# Patient Record
Sex: Female | Born: 1955 | Race: White | Hispanic: No | Smoking: Never smoker
Health system: Southern US, Community
[De-identification: ages and names within clinical notes are randomized; demographics above are authoritative.]

## PROBLEM LIST (undated history)

## (undated) DIAGNOSIS — I10 Essential (primary) hypertension: Secondary | ICD-10-CM

---

## 1999-08-06 ENCOUNTER — Other Ambulatory Visit: Admission: RE | Admit: 1999-08-06 | Discharge: 1999-08-06 | Payer: Self-pay | Admitting: Gynecology

## 2000-08-24 ENCOUNTER — Other Ambulatory Visit: Admission: RE | Admit: 2000-08-24 | Discharge: 2000-08-24 | Payer: Self-pay | Admitting: Gynecology

## 2001-10-05 ENCOUNTER — Encounter: Payer: Self-pay | Admitting: Gynecology

## 2001-10-05 ENCOUNTER — Encounter: Admission: RE | Admit: 2001-10-05 | Discharge: 2001-10-05 | Payer: Self-pay | Admitting: Gynecology

## 2001-10-16 ENCOUNTER — Other Ambulatory Visit: Admission: RE | Admit: 2001-10-16 | Discharge: 2001-10-16 | Payer: Self-pay | Admitting: Gynecology

## 2002-11-11 ENCOUNTER — Other Ambulatory Visit: Admission: RE | Admit: 2002-11-11 | Discharge: 2002-11-11 | Payer: Self-pay | Admitting: Gynecology

## 2004-03-22 ENCOUNTER — Other Ambulatory Visit: Admission: RE | Admit: 2004-03-22 | Discharge: 2004-03-22 | Payer: Self-pay | Admitting: Gynecology

## 2005-05-16 ENCOUNTER — Other Ambulatory Visit: Admission: RE | Admit: 2005-05-16 | Discharge: 2005-05-16 | Payer: Self-pay | Admitting: Gynecology

## 2005-05-31 ENCOUNTER — Encounter: Admission: RE | Admit: 2005-05-31 | Discharge: 2005-05-31 | Payer: Self-pay | Admitting: Gynecology

## 2006-12-04 ENCOUNTER — Other Ambulatory Visit: Admission: RE | Admit: 2006-12-04 | Discharge: 2006-12-04 | Payer: Self-pay | Admitting: Gynecology

## 2008-01-29 ENCOUNTER — Encounter: Admission: RE | Admit: 2008-01-29 | Discharge: 2008-01-29 | Payer: Self-pay | Admitting: Gynecology

## 2010-08-17 ENCOUNTER — Encounter: Admission: RE | Admit: 2010-08-17 | Discharge: 2010-08-17 | Payer: Self-pay | Admitting: Gynecology

## 2012-02-24 ENCOUNTER — Other Ambulatory Visit: Payer: Self-pay | Admitting: Gynecology

## 2012-02-24 DIAGNOSIS — Z1231 Encounter for screening mammogram for malignant neoplasm of breast: Secondary | ICD-10-CM

## 2012-03-02 ENCOUNTER — Ambulatory Visit
Admission: RE | Admit: 2012-03-02 | Discharge: 2012-03-02 | Disposition: A | Discharge status: Home / Self Care | Payer: BC Managed Care – PPO | Source: Ambulatory Visit | Attending: Gynecology | Admitting: Gynecology

## 2012-03-02 DIAGNOSIS — Z1231 Encounter for screening mammogram for malignant neoplasm of breast: Secondary | ICD-10-CM

## 2014-09-23 ENCOUNTER — Other Ambulatory Visit: Payer: Self-pay

## 2014-09-23 DIAGNOSIS — Z1231 Encounter for screening mammogram for malignant neoplasm of breast: Secondary | ICD-10-CM

## 2014-10-07 ENCOUNTER — Ambulatory Visit
Admission: RE | Admit: 2014-10-07 | Discharge: 2014-10-07 | Disposition: A | Discharge status: Home / Self Care | Payer: BC Managed Care – PPO | Source: Ambulatory Visit

## 2014-10-07 DIAGNOSIS — Z1231 Encounter for screening mammogram for malignant neoplasm of breast: Secondary | ICD-10-CM

## 2016-03-29 DIAGNOSIS — B009 Herpesviral infection, unspecified: Secondary | ICD-10-CM | POA: Diagnosis not present

## 2016-03-29 DIAGNOSIS — Z0000 Encounter for general adult medical examination without abnormal findings: Secondary | ICD-10-CM | POA: Diagnosis not present

## 2016-03-29 DIAGNOSIS — I10 Essential (primary) hypertension: Secondary | ICD-10-CM | POA: Diagnosis not present

## 2016-10-04 ENCOUNTER — Encounter (HOSPITAL_COMMUNITY): Payer: Self-pay | Admitting: *Deleted

## 2016-10-04 DIAGNOSIS — W01198A Fall on same level from slipping, tripping and stumbling with subsequent striking against other object, initial encounter: Secondary | ICD-10-CM | POA: Diagnosis not present

## 2016-10-04 DIAGNOSIS — Y939 Activity, unspecified: Secondary | ICD-10-CM | POA: Insufficient documentation

## 2016-10-04 DIAGNOSIS — Z79899 Other long term (current) drug therapy: Secondary | ICD-10-CM | POA: Diagnosis not present

## 2016-10-04 DIAGNOSIS — S0003XA Contusion of scalp, initial encounter: Secondary | ICD-10-CM | POA: Diagnosis not present

## 2016-10-04 DIAGNOSIS — S0990XA Unspecified injury of head, initial encounter: Secondary | ICD-10-CM | POA: Diagnosis present

## 2016-10-04 DIAGNOSIS — Y929 Unspecified place or not applicable: Secondary | ICD-10-CM | POA: Diagnosis not present

## 2016-10-04 DIAGNOSIS — S20212A Contusion of left front wall of thorax, initial encounter: Secondary | ICD-10-CM | POA: Diagnosis not present

## 2016-10-04 DIAGNOSIS — Y999 Unspecified external cause status: Secondary | ICD-10-CM | POA: Insufficient documentation

## 2016-10-04 DIAGNOSIS — I10 Essential (primary) hypertension: Secondary | ICD-10-CM | POA: Diagnosis not present

## 2016-10-04 NOTE — ED Triage Notes (Signed)
The pt was standing on a vanity cleaning her light fisture wheb she lost her babalce abd fell backward and struck the back of her head causing  A laceration  With ? Loc  The fall occurred at 1600  She is also c/o lt lower rib pain especially with movement

## 2016-10-05 ENCOUNTER — Emergency Department (HOSPITAL_COMMUNITY): Payer: Managed Care, Other (non HMO)

## 2016-10-05 ENCOUNTER — Emergency Department (HOSPITAL_COMMUNITY)
Admission: EM | Admit: 2016-10-05 | Discharge: 2016-10-05 | Disposition: A | Discharge status: Home / Self Care | Payer: Managed Care, Other (non HMO) | Attending: Emergency Medicine | Admitting: Emergency Medicine

## 2016-10-05 DIAGNOSIS — S20212A Contusion of left front wall of thorax, initial encounter: Secondary | ICD-10-CM

## 2016-10-05 DIAGNOSIS — W19XXXA Unspecified fall, initial encounter: Secondary | ICD-10-CM

## 2016-10-05 DIAGNOSIS — S0093XA Contusion of unspecified part of head, initial encounter: Secondary | ICD-10-CM

## 2016-10-05 HISTORY — DX: Essential (primary) hypertension: I10

## 2016-10-05 MED ORDER — KETOROLAC TROMETHAMINE 15 MG/ML IJ SOLN
15.0000 mg | Freq: Once | INTRAMUSCULAR | Status: AC
  Administered 2016-10-05: 15 mg via INTRAVENOUS
  Filled 2016-10-05: qty 1

## 2016-10-05 MED ORDER — HYDROCODONE-ACETAMINOPHEN 5-325 MG PO TABS
1.0000 | ORAL_TABLET | Freq: Once | ORAL | Status: AC
  Administered 2016-10-05: 1 via ORAL
  Filled 2016-10-05: qty 1

## 2016-10-05 MED ORDER — METHOCARBAMOL 500 MG PO TABS: 500.0000 mg | ORAL_TABLET | Freq: Two times a day (BID) | ORAL | 0 refills | Status: AC

## 2016-10-05 MED ORDER — HYDROCODONE-ACETAMINOPHEN 5-325 MG PO TABS: 1.0000 | ORAL_TABLET | Freq: Four times a day (QID) | ORAL | 0 refills | Status: AC | PRN

## 2016-10-05 MED ORDER — IBUPROFEN 400 MG PO TABS: 400.0000 mg | ORAL_TABLET | Freq: Four times a day (QID) | ORAL | 0 refills | Status: AC | PRN

## 2016-10-05 NOTE — ED Provider Notes (Signed)
[facility]EM[facility]ENCY DEPT Provider Note   CSN: [id number] Arrival date & time: 10/04/16  1933  By signing my name below, I, Doreatha MartinEva Mathews, attest that this documentation has been prepared under the direction and in the presence of Derwood KaplanAnkit Zebastian Carico, MD. Electronically Signed: Doreatha MartinEva Mathews, ED Scribe. 10/05/16. 1:17 AM.     History   Chief Complaint Chief Complaint  Patient presents with  . Fall    HPI Elizabeth Clements is a 60 y.o. female who presents to the Emergency Department complaining of a laceration with controlled bleeding to the scalp s/p mechanical fall that occurred last night. Pt states she was standing on a counter cleaning when her foot slipped and she fell backward, striking her head on the tile floor. She denies LOC. She also complains of dorsal HA, left-sided CP at this time. Pt states her CP is worsened with breathing and pressure application. She states she has not looked at her chest to appreciate any bruising. Pt does not currently take any anticoagulants. She denies seizure activity, confusion, numbness, tingling, vomiting, additional injuries.    The history is provided by the patient. No language interpreter was used.    Past Medical History:  Diagnosis Date  . Hypertension     There are no active problems to display for this patient.   History reviewed. No pertinent surgical history.  OB History    No data available       Home Medications    Prior to Admission medications   Medication Sig Start Date End Date Taking? Authorizing Provider  losartan-hydrochlorothiazide (HYZAAR) 100-12.5 MG tablet Take 1 tablet by mouth daily. 09/28/16  Yes Historical Provider, MD  HYDROcodone-acetaminophen (NORCO/VICODIN) 5-325 MG tablet Take 1 tablet by mouth every 6 (six) hours as needed for severe pain. 10/05/16   Derwood KaplanAnkit Nahomi Hegner, MD  ibuprofen (ADVIL,MOTRIN) 400 MG tablet Take 1 tablet (400 mg total) by mouth every 6 (six) hours as needed. 10/05/16   Derwood KaplanAnkit Jamareon Shimel, MD    methocarbamol (ROBAXIN) 500 MG tablet Take 1 tablet (500 mg total) by mouth 2 (two) times daily. 10/05/16   Derwood KaplanAnkit Gwynn Chalker, MD    Family History No family history on file.  Social History Social History  Substance Use Topics  . Smoking status: Never Smoker  . Smokeless tobacco: Never Used  . Alcohol use No     Allergies   Penicillins   Review of Systems Review of Systems A complete 10 system review of systems was obtained and all systems are negative except as noted in the HPI and PMH.    Physical Exam Updated Vital Signs BP 132/88   Pulse 70   Temp 98 F (36.7 C)   Resp 18   Ht 5' 1.5" (1.562 m)   Wt 147 lb 8 oz (66.9 kg)   SpO2 100%   BMI 27.42 kg/m   Physical Exam  Constitutional: She appears well-developed and well-nourished.  HENT:  Head: Normocephalic.  Pt has a posterior scalp hematoma with bruising and a very superficial laceration that measures 1 cm. No active bleeding at this time.   Eyes: Conjunctivae are normal.  Cardiovascular: Normal rate, regular rhythm and normal heart sounds.   Pulmonary/Chest: Effort normal and breath sounds normal. No respiratory distress. She has no wheezes.  Bilateral breath sounds.   Abdominal: She exhibits no distension.  Musculoskeletal: Normal range of motion.  No midline C-spine tenderness. Pt does have tenderness with lateral movement of the head. Pt has left sided tenderness over the hip; however, ROM  of the left hip, internal and external rotation, and resistance activity do not show profound discomfort.   Neurological: She is alert.  Skin: Skin is warm and dry.  Psychiatric: She has a normal mood and affect. Her behavior is normal.  Nursing note and vitals reviewed.    ED Treatments / Results   DIAGNOSTIC STUDIES: Oxygen Saturation is 100% on RA, normal by my interpretation.    COORDINATION OF CARE: 12:53 AM Discussed treatment plan with pt at bedside which includes CT head, XR left ribs w/ chest and pt  agreed to plan.    Labs (all labs ordered are listed, but only abnormal results are displayed) Labs Reviewed - No data to display  EKG  EKG Interpretation None       Radiology Dg Ribs Unilateral W/chest Left  Result Date: 10/05/2016 CLINICAL DATA:  Status post fall, with left anterior rib pain. Initial encounter. EXAM: LEFT RIBS AND CHEST - 3+ VIEW COMPARISON:  None. FINDINGS: No displaced rib fractures are seen. The lungs are well-aerated. Minimal left basilar atelectasis is noted. There is no evidence of pleural effusion or pneumothorax. The cardiomediastinal silhouette is within normal limits. No acute osseous abnormalities are seen. IMPRESSION: Minimal left basilar atelectasis noted. Lungs otherwise clear. No displaced rib fracture seen. Electronically Signed   By: Roanna Raider M.D.   On: 10/05/2016 02:06   Ct Head Wo Contrast  Result Date: 10/05/2016 CLINICAL DATA:  Fall with head laceration. Slip and fall while cleaning, striking head on tile floor. No loss of consciousness. EXAM: CT HEAD WITHOUT CONTRAST TECHNIQUE: Contiguous axial images were obtained from the base of the skull through the vertex without intravenous contrast. COMPARISON:  None. FINDINGS: Brain: No evidence of acute infarction, hemorrhage, hydrocephalus, extra-axial collection or mass lesion/mass effect. Vascular: No hyperdense vessel or unexpected calcification. Skull: Small left parietal scalp laceration and hematoma. No calvarial fracture. Sinuses/Orbits: No acute finding. Other: None. IMPRESSION: Left parietal scalp laceration and hematoma. No fracture or acute intracranial abnormality. Electronically Signed   By: Rubye Oaks M.D.   On: 10/05/2016 03:19    Procedures Procedures (including critical care time)  Medications Ordered in ED Medications  ketorolac (TORADOL) 15 MG/ML injection 15 mg (15 mg Intravenous Given 10/05/16 0336)  HYDROcodone-acetaminophen (NORCO/VICODIN) 5-325 MG per tablet 1  tablet (1 tablet Oral Given 10/05/16 0336)     Initial Impression / Assessment and Plan / ED Course  I have reviewed the triage vital signs and the nursing notes.  Pertinent labs & imaging results that were available during my care of the patient were reviewed by me and considered in my medical decision making (see chart for details).  Clinical Course   I personally performed the services described in this documentation, which was scribed in my presence. The recorded information has been reviewed and is accurate.  PT with fall. DDx includes: - Mechanical falls - ICH - Fractures - Contusions - Soft tissue injury  Appropriate imaging ordered, and look reassuring. Cspine cleared clinically.   Final Clinical Impressions(s) / ED Diagnoses   Final diagnoses:  Fall, initial encounter  Contusion of head, unspecified part of head, initial encounter  Rib contusion, left, initial encounter    New Prescriptions Discharge Medication List as of 10/05/2016  3:29 AM    START taking these medications   Details  HYDROcodone-acetaminophen (NORCO/VICODIN) 5-325 MG tablet Take 1 tablet by mouth every 6 (six) hours as needed for severe pain., Starting Wed 10/05/2016, Print    ibuprofen (ADVIL,MOTRIN)  400 MG tablet Take 1 tablet (400 mg total) by mouth every 6 (six) hours as needed., Starting Wed 10/05/2016, Print    methocarbamol (ROBAXIN) 500 MG tablet Take 1 tablet (500 mg total) by mouth 2 (two) times daily., Starting Wed 10/05/2016, Print           Derwood Kaplan, MD 10/06/16 [phone]

## 2016-10-05 NOTE — Discharge Instructions (Signed)
We saw you in the ER after you had a fall. All the imaging results are normal, no fractures seen. No evidence of brain bleed. Please be very careful with walking, and do everything possible to prevent falls.  You likely have contusion from the trauma, and the pain might get worse in 1-2 days. Please take ibuprofen round the clock for the 2 days and then as needed.

## 2016-10-05 NOTE — ED Notes (Signed)
Pt back from CT

## 2016-10-05 NOTE — ED Notes (Signed)
Patient transported to CT via stretcher.

## 2016-10-05 NOTE — ED Notes (Signed)
Nanavati, MD at bedside. 

## 2017-03-15 ENCOUNTER — Ambulatory Visit
Admission: RE | Admit: 2017-03-15 | Discharge: 2017-03-15 | Disposition: A | Discharge status: Home / Self Care | Payer: Managed Care, Other (non HMO) | Source: Ambulatory Visit | Attending: Family Medicine | Admitting: Family Medicine

## 2017-03-15 ENCOUNTER — Other Ambulatory Visit: Payer: Self-pay | Admitting: Family Medicine

## 2017-03-15 DIAGNOSIS — Z1231 Encounter for screening mammogram for malignant neoplasm of breast: Secondary | ICD-10-CM

## 2017-06-16 ENCOUNTER — Other Ambulatory Visit: Payer: Self-pay | Admitting: Family Medicine

## 2017-06-16 ENCOUNTER — Other Ambulatory Visit (HOSPITAL_COMMUNITY)
Admission: RE | Admit: 2017-06-16 | Discharge: 2017-06-16 | Disposition: A | Discharge status: Home / Self Care | Payer: BLUE CROSS/BLUE SHIELD | Source: Ambulatory Visit | Attending: Family Medicine | Admitting: Family Medicine

## 2017-06-16 DIAGNOSIS — I10 Essential (primary) hypertension: Secondary | ICD-10-CM | POA: Diagnosis not present

## 2017-06-16 DIAGNOSIS — Z124 Encounter for screening for malignant neoplasm of cervix: Secondary | ICD-10-CM | POA: Insufficient documentation

## 2017-06-16 DIAGNOSIS — Z0000 Encounter for general adult medical examination without abnormal findings: Secondary | ICD-10-CM | POA: Diagnosis not present

## 2017-06-20 LAB — CYTOLOGY - PAP: DIAGNOSIS: NEGATIVE

## 2017-07-26 DIAGNOSIS — D179 Benign lipomatous neoplasm, unspecified: Secondary | ICD-10-CM | POA: Diagnosis not present

## 2018-06-21 DIAGNOSIS — I10 Essential (primary) hypertension: Secondary | ICD-10-CM | POA: Diagnosis not present

## 2018-06-21 DIAGNOSIS — Z0000 Encounter for general adult medical examination without abnormal findings: Secondary | ICD-10-CM | POA: Diagnosis not present

## 2018-06-21 DIAGNOSIS — Z1159 Encounter for screening for other viral diseases: Secondary | ICD-10-CM | POA: Diagnosis not present

## 2018-07-04 DIAGNOSIS — R894 Abnormal immunological findings in specimens from other organs, systems and tissues: Secondary | ICD-10-CM | POA: Diagnosis not present

## 2018-07-04 DIAGNOSIS — R896 Abnormal cytological findings in specimens from other organs, systems and tissues: Secondary | ICD-10-CM | POA: Diagnosis not present

## 2018-07-27 DIAGNOSIS — D1720 Benign lipomatous neoplasm of skin and subcutaneous tissue of unspecified limb: Secondary | ICD-10-CM | POA: Diagnosis not present

## 2018-08-06 DIAGNOSIS — L259 Unspecified contact dermatitis, unspecified cause: Secondary | ICD-10-CM | POA: Diagnosis not present

## 2018-08-30 DIAGNOSIS — B182 Chronic viral hepatitis C: Secondary | ICD-10-CM | POA: Diagnosis not present

## 2018-09-05 ENCOUNTER — Other Ambulatory Visit: Payer: Self-pay | Admitting: Nurse Practitioner

## 2018-09-05 DIAGNOSIS — B182 Chronic viral hepatitis C: Secondary | ICD-10-CM

## 2018-09-13 ENCOUNTER — Ambulatory Visit
Admission: RE | Admit: 2018-09-13 | Discharge: 2018-09-13 | Disposition: A | Discharge status: Home / Self Care | Payer: BLUE CROSS/BLUE SHIELD | Source: Ambulatory Visit | Attending: Nurse Practitioner | Admitting: Nurse Practitioner

## 2018-09-13 DIAGNOSIS — B182 Chronic viral hepatitis C: Secondary | ICD-10-CM

## 2018-10-10 DIAGNOSIS — B182 Chronic viral hepatitis C: Secondary | ICD-10-CM | POA: Diagnosis not present

## 2018-10-15 ENCOUNTER — Other Ambulatory Visit: Payer: Self-pay | Admitting: Family Medicine

## 2018-10-15 DIAGNOSIS — Z1231 Encounter for screening mammogram for malignant neoplasm of breast: Secondary | ICD-10-CM

## 2018-11-05 DIAGNOSIS — Z780 Asymptomatic menopausal state: Secondary | ICD-10-CM | POA: Diagnosis not present

## 2018-11-05 DIAGNOSIS — M8588 Other specified disorders of bone density and structure, other site: Secondary | ICD-10-CM | POA: Diagnosis not present

## 2018-11-19 DIAGNOSIS — B182 Chronic viral hepatitis C: Secondary | ICD-10-CM | POA: Diagnosis not present

## 2018-11-20 ENCOUNTER — Ambulatory Visit
Admission: RE | Admit: 2018-11-20 | Discharge: 2018-11-20 | Disposition: A | Discharge status: Home / Self Care | Payer: BLUE CROSS/BLUE SHIELD | Source: Ambulatory Visit | Attending: Family Medicine | Admitting: Family Medicine

## 2018-11-20 DIAGNOSIS — Z1231 Encounter for screening mammogram for malignant neoplasm of breast: Secondary | ICD-10-CM | POA: Diagnosis not present

## 2018-12-14 DIAGNOSIS — B182 Chronic viral hepatitis C: Secondary | ICD-10-CM | POA: Diagnosis not present

## 2018-12-31 DIAGNOSIS — B182 Chronic viral hepatitis C: Secondary | ICD-10-CM | POA: Diagnosis not present

## 2019-03-07 DIAGNOSIS — B182 Chronic viral hepatitis C: Secondary | ICD-10-CM | POA: Diagnosis not present

## 2019-03-18 DIAGNOSIS — B182 Chronic viral hepatitis C: Secondary | ICD-10-CM | POA: Diagnosis not present

## 2019-03-18 DIAGNOSIS — K824 Cholesterolosis of gallbladder: Secondary | ICD-10-CM | POA: Diagnosis not present

## 2019-07-29 ENCOUNTER — Other Ambulatory Visit: Payer: Self-pay | Admitting: Family Medicine

## 2019-07-29 DIAGNOSIS — K829 Disease of gallbladder, unspecified: Secondary | ICD-10-CM

## 2019-07-29 DIAGNOSIS — I10 Essential (primary) hypertension: Secondary | ICD-10-CM | POA: Diagnosis not present

## 2019-07-29 DIAGNOSIS — R932 Abnormal findings on diagnostic imaging of liver and biliary tract: Secondary | ICD-10-CM | POA: Diagnosis not present

## 2019-07-29 DIAGNOSIS — N6012 Diffuse cystic mastopathy of left breast: Secondary | ICD-10-CM

## 2019-07-29 DIAGNOSIS — N644 Mastodynia: Secondary | ICD-10-CM

## 2019-07-29 DIAGNOSIS — Z0000 Encounter for general adult medical examination without abnormal findings: Secondary | ICD-10-CM | POA: Diagnosis not present

## 2019-07-29 DIAGNOSIS — B1920 Unspecified viral hepatitis C without hepatic coma: Secondary | ICD-10-CM | POA: Diagnosis not present

## 2019-08-01 ENCOUNTER — Other Ambulatory Visit: Payer: Self-pay

## 2019-08-01 ENCOUNTER — Ambulatory Visit
Admission: RE | Admit: 2019-08-01 | Discharge: 2019-08-01 | Disposition: A | Discharge status: Home / Self Care | Payer: BC Managed Care – PPO | Source: Ambulatory Visit | Attending: Family Medicine | Admitting: Family Medicine

## 2019-08-01 ENCOUNTER — Ambulatory Visit: Payer: BLUE CROSS/BLUE SHIELD

## 2019-08-01 DIAGNOSIS — N644 Mastodynia: Secondary | ICD-10-CM | POA: Diagnosis not present

## 2019-08-01 DIAGNOSIS — N6012 Diffuse cystic mastopathy of left breast: Secondary | ICD-10-CM

## 2019-08-05 ENCOUNTER — Other Ambulatory Visit: Payer: BLUE CROSS/BLUE SHIELD

## 2019-08-08 ENCOUNTER — Ambulatory Visit
Admission: RE | Admit: 2019-08-08 | Discharge: 2019-08-08 | Disposition: A | Discharge status: Home / Self Care | Payer: BC Managed Care – PPO | Source: Ambulatory Visit | Attending: Family Medicine | Admitting: Family Medicine

## 2019-08-08 DIAGNOSIS — K838 Other specified diseases of biliary tract: Secondary | ICD-10-CM | POA: Diagnosis not present

## 2019-08-08 DIAGNOSIS — K829 Disease of gallbladder, unspecified: Secondary | ICD-10-CM

## 2019-08-08 DIAGNOSIS — K7689 Other specified diseases of liver: Secondary | ICD-10-CM | POA: Diagnosis not present

## 2019-08-08 DIAGNOSIS — K828 Other specified diseases of gallbladder: Secondary | ICD-10-CM | POA: Diagnosis not present

## 2019-11-05 ENCOUNTER — Other Ambulatory Visit: Payer: Self-pay | Admitting: Family Medicine

## 2019-11-05 DIAGNOSIS — Z1231 Encounter for screening mammogram for malignant neoplasm of breast: Secondary | ICD-10-CM

## 2019-12-26 ENCOUNTER — Ambulatory Visit: Payer: BC Managed Care – PPO

## 2019-12-31 ENCOUNTER — Other Ambulatory Visit: Payer: Self-pay

## 2019-12-31 ENCOUNTER — Ambulatory Visit
Admission: RE | Admit: 2019-12-31 | Discharge: 2019-12-31 | Disposition: A | Discharge status: Home / Self Care | Payer: BC Managed Care – PPO | Source: Ambulatory Visit | Attending: Family Medicine | Admitting: Family Medicine

## 2019-12-31 DIAGNOSIS — Z1231 Encounter for screening mammogram for malignant neoplasm of breast: Secondary | ICD-10-CM | POA: Diagnosis not present

## 2020-07-29 ENCOUNTER — Other Ambulatory Visit (HOSPITAL_COMMUNITY)
Admission: RE | Admit: 2020-07-29 | Discharge: 2020-07-29 | Disposition: A | Discharge status: Home / Self Care | Payer: BC Managed Care – PPO | Source: Ambulatory Visit | Attending: Family Medicine | Admitting: Family Medicine

## 2020-07-29 ENCOUNTER — Other Ambulatory Visit: Payer: Self-pay | Admitting: Family Medicine

## 2020-07-29 DIAGNOSIS — Z0000 Encounter for general adult medical examination without abnormal findings: Secondary | ICD-10-CM | POA: Diagnosis not present

## 2020-07-29 DIAGNOSIS — Z124 Encounter for screening for malignant neoplasm of cervix: Secondary | ICD-10-CM | POA: Insufficient documentation

## 2020-07-29 DIAGNOSIS — R894 Abnormal immunological findings in specimens from other organs, systems and tissues: Secondary | ICD-10-CM | POA: Diagnosis not present

## 2020-07-29 DIAGNOSIS — I10 Essential (primary) hypertension: Secondary | ICD-10-CM | POA: Diagnosis not present

## 2020-07-29 DIAGNOSIS — Z13220 Encounter for screening for lipoid disorders: Secondary | ICD-10-CM | POA: Diagnosis not present

## 2020-07-31 LAB — CYTOLOGY - PAP: Diagnosis: NEGATIVE

## 2020-08-23 IMAGING — US US ABDOMEN COMPLETE W/ ELASTOGRAPHY
1 series · 12 of 25 positions shown · non-contrast
Comparison: None.

CLINICAL DATA: Chronic hepatitis-C.



[Series 1: us abdomen complete w/ elastography · 0.14mm/px · 12 of 89 slices shown]
[im 4/89]
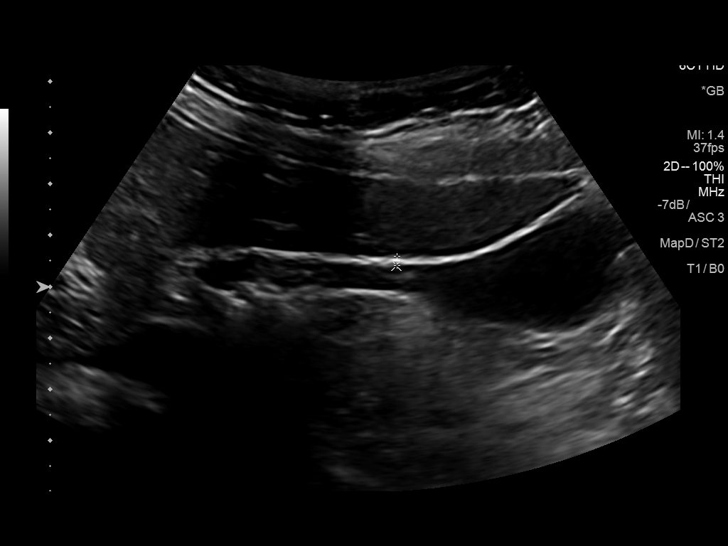
[im 12/89]
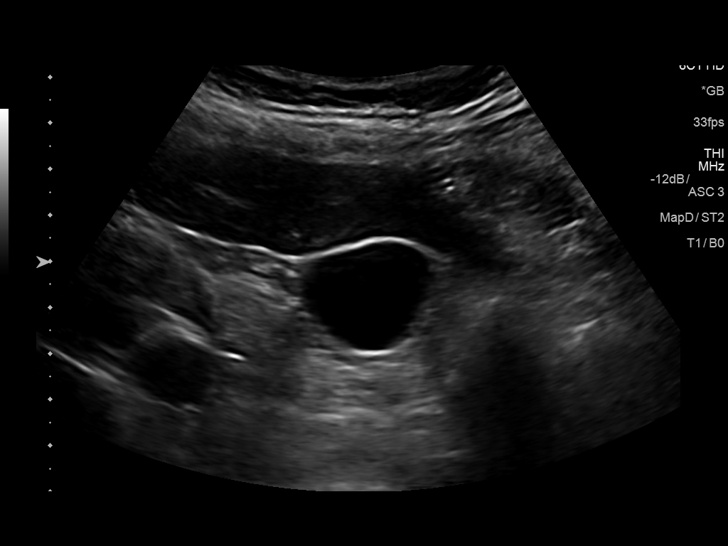
[im 19/89]
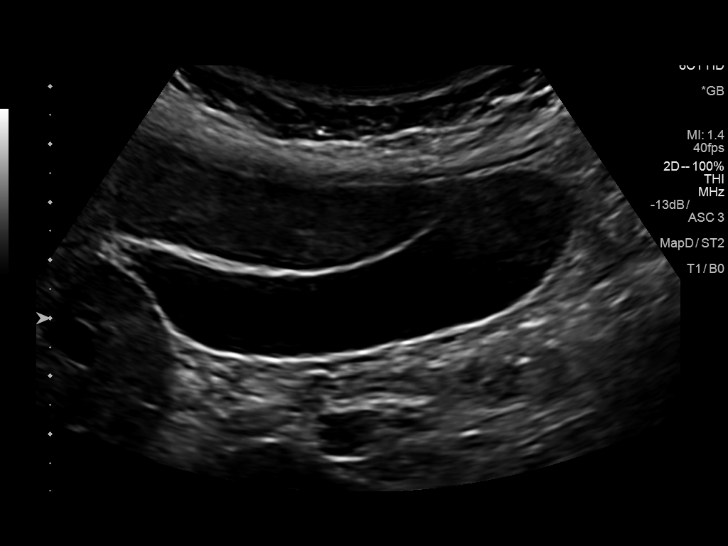
[im 26/89]
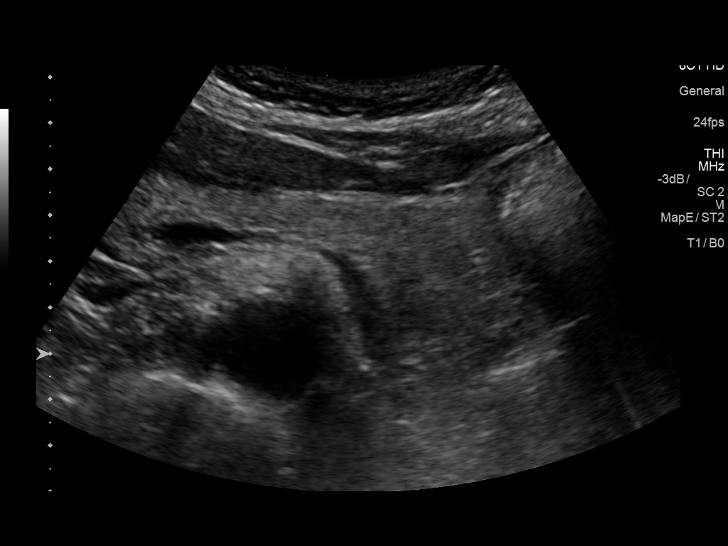
[im 34/89]
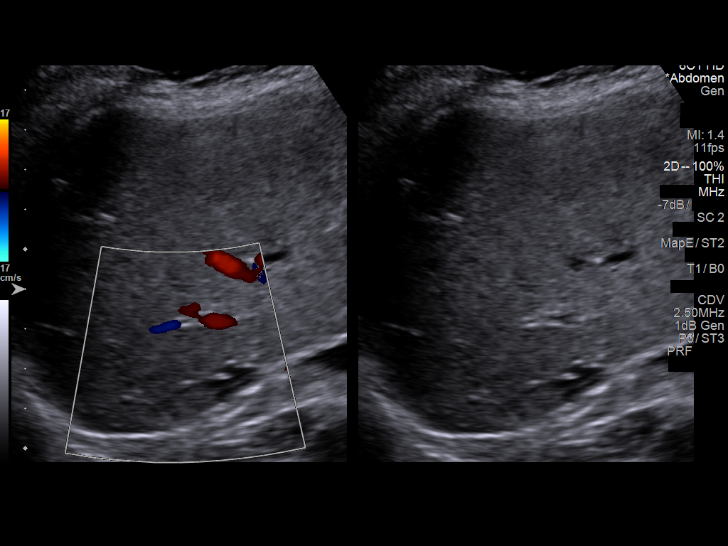
[im 41/89]
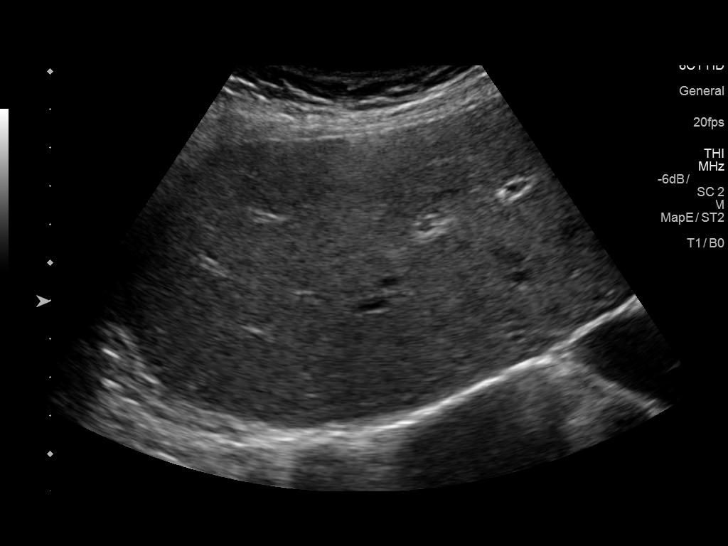
[im 48/89]
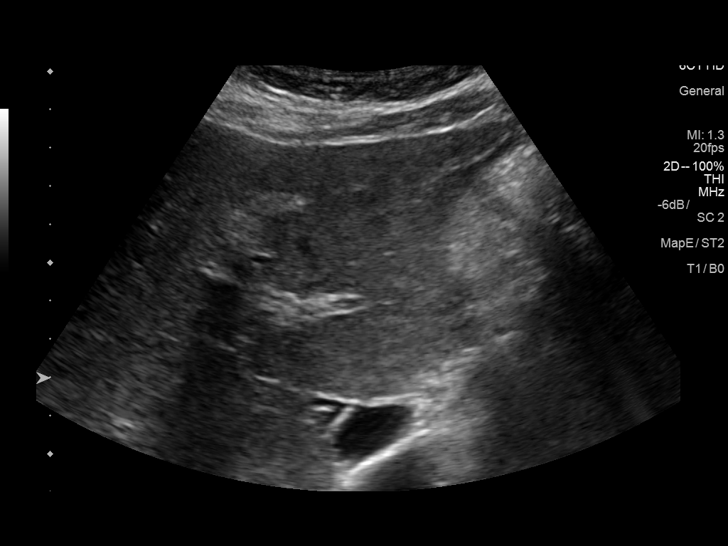
[im 56/89]
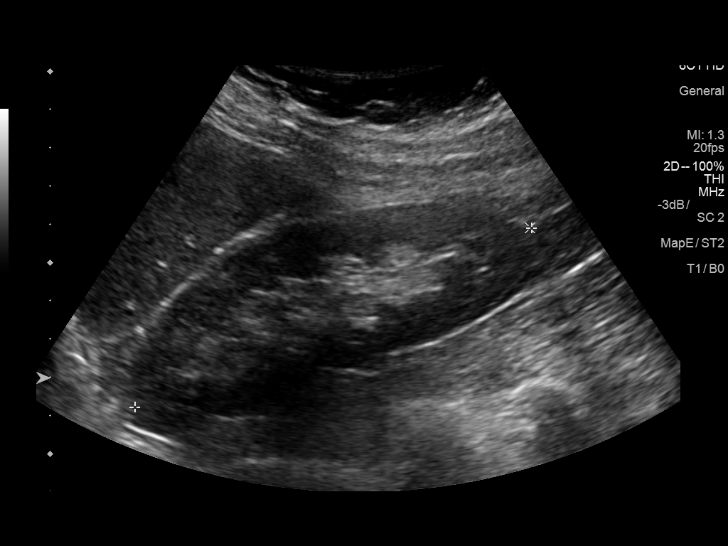
[im 63/89]
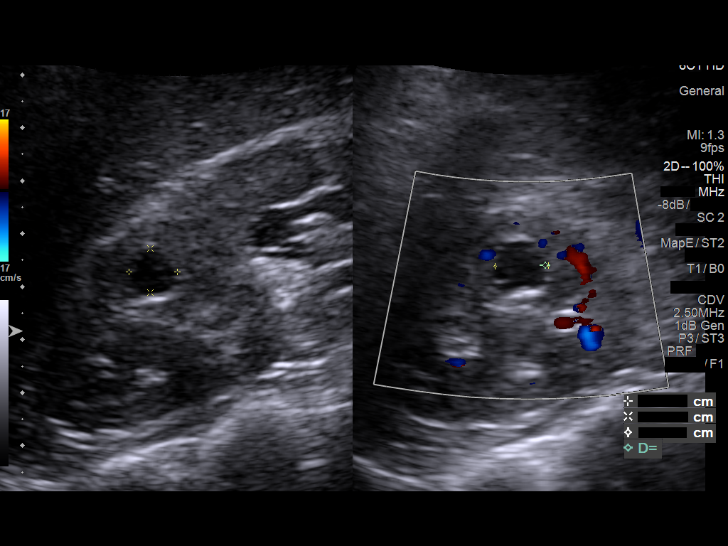
[im 70/89]
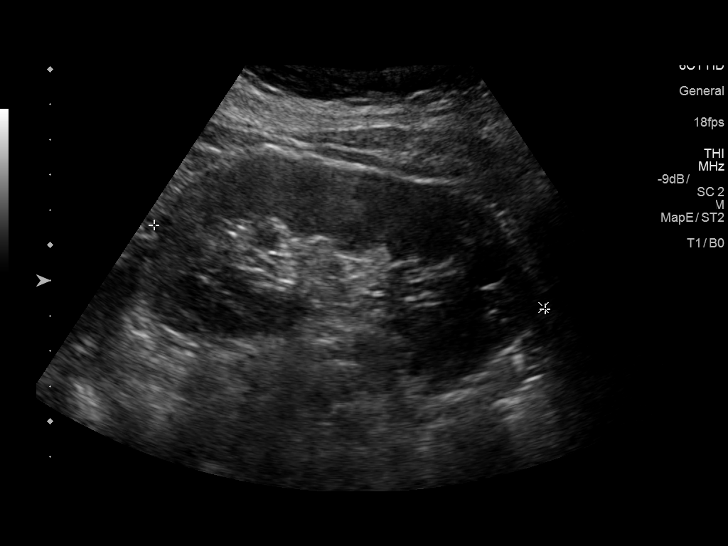
[im 78/89]
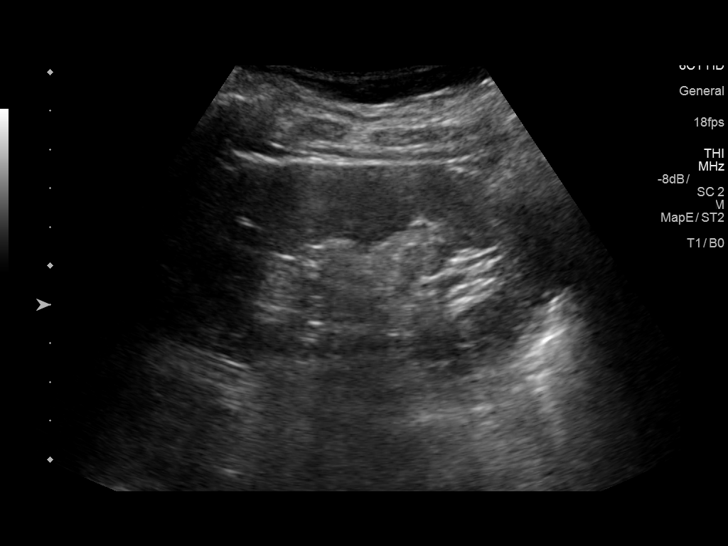
[im 85/89]
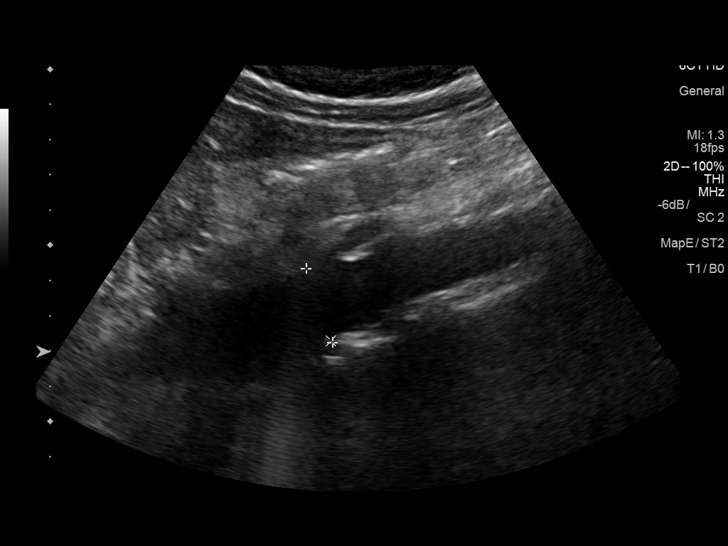

[12 of 25 positions shown; findings below may reference images not displayed]

FINDINGS: ULTRASOUND ABDOMEN

Gallbladder: No gallstones identified. A 8 mm hypoechoic filling
defect is seen near the gallbladder neck which shows no acoustic
shadowing. This could represent a gallbladder polyp or tumefactive
sludge. No evidence of gallbladder wall thickening or
pericholecystic fluid. No sonographic Murphy sign noted by
sonographer.

Common bile duct: Diameter: 5 mm, within normal limits.

Liver: Small cyst in posterior right hepatic lobe measuring 1.3 cm.
No solid liver mass identified. Within normal limits in parenchymal
echogenicity. Portal vein is patent on color Doppler imaging with
normal direction of blood flow towards the liver.

IVC: No abnormality visualized.

Pancreas: Visualized portion unremarkable.

Spleen: Size and appearance within normal limits.

Right Kidney: Length: 11.4 cm. Echogenicity within normal limits.
1.0 cm cyst seen in upper pole. No mass or hydronephrosis
visualized.

Left Kidney: Length: 11.3 cm. Echogenicity within normal limits.
cm cyst seen in lower pole. No mass or hydronephrosis visualized.

Abdominal aorta: No aneurysm visualized.

Other findings: None.

ULTRASOUND HEPATIC ELASTOGRAPHY

Device: Siemens Helix VTQ

Patient position: Oblique

Transducer [id number]

Number of measurements: 10

Hepatic segment:  8

Median velocity:   0.72 m/sec

IQR:

IQR/Median velocity ratio:

Corresponding Metavir fibrosis score:  F0/F1

Risk of fibrosis: Minimal

Limitations of exam: None

Please note that abnormal shear wave velocities may also be
identified in clinical settings other than with hepatic fibrosis,
such as: acute hepatitis, elevated right heart and central venous
pressures including use of beta blockers, Adursh disease
(Nicasio), infiltrative processes such as
mastocytosis/amyloidosis/infiltrative tumor, extrahepatic
cholestasis, in the post-prandial state, and liver transplantation.
Correlation with patient history, laboratory data, and clinical
condition recommended.
IMPRESSION: ULTRASOUND ABDOMEN:

8 mm gallbladder polyp versus tumefactive sludge. Recommend
follow-up right upper quadrant ultrasound in 1 month.

No other significant abnormality identified.

ULTRASOUND HEPATIC ELASTOGRAPHY:

Median hepatic shear wave velocity is calculated at 0.72 m/sec.

Corresponding Metavir fibrosis score is F0/F1.

Risk of fibrosis is Minimal.

Follow-up: None required

## 2020-10-22 IMAGING — MG DIGITAL SCREENING BILAT W/ TOMO W/ CAD
8 series · 8 of 24 positions shown · non-contrast
Comparison: Previous exam(s).

CLINICAL DATA: Screening.

EXAM:
DIGITAL SCREENING BILATERAL MAMMOGRAM WITH TOMO AND CAD

[R MLO synth-2D]
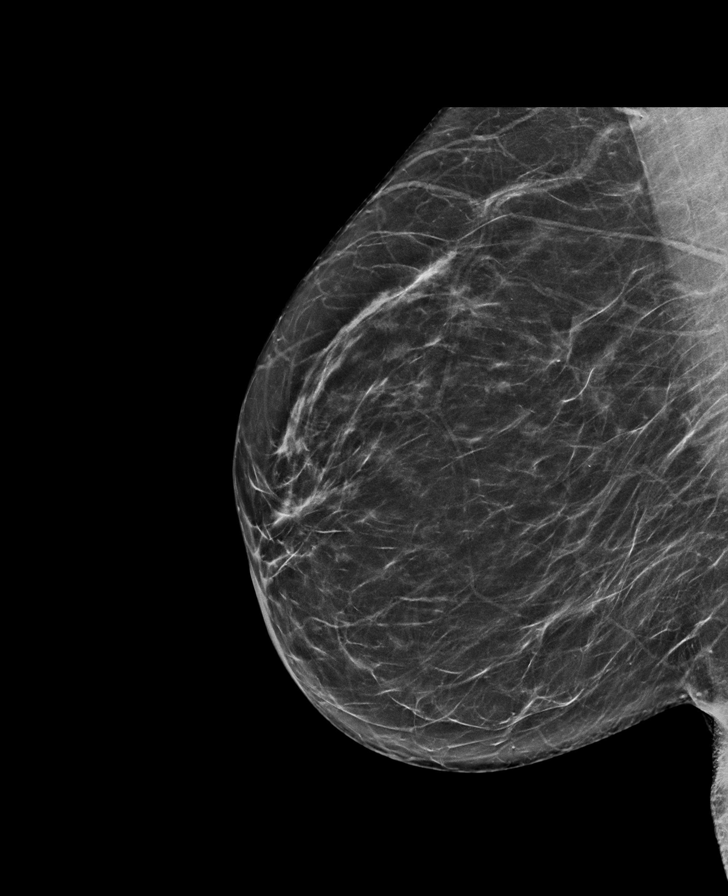

[L CC synth-2D]
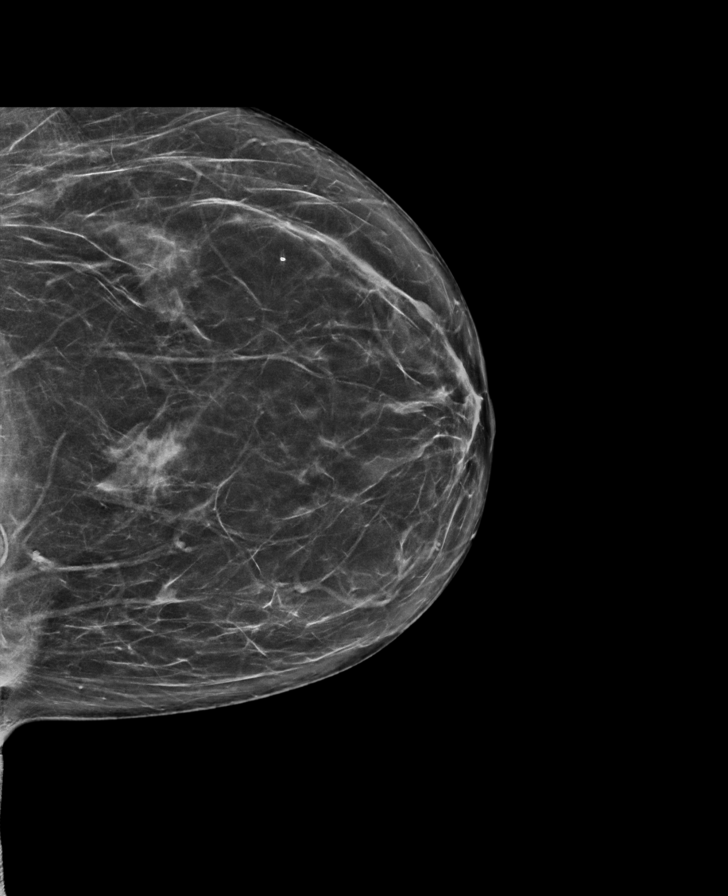

[L MLO synth-2D]
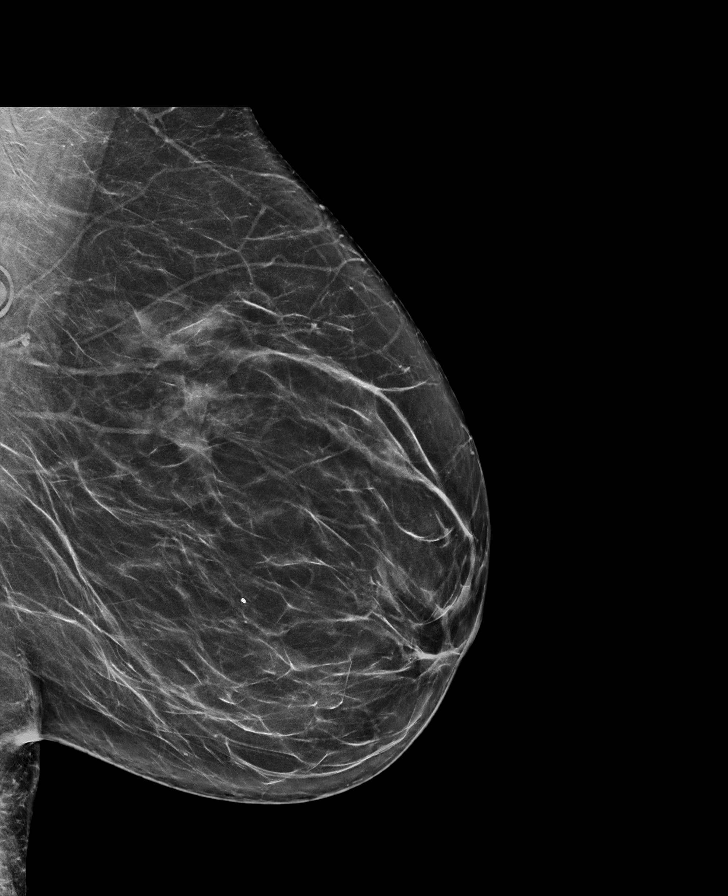

[R CC synth-2D]
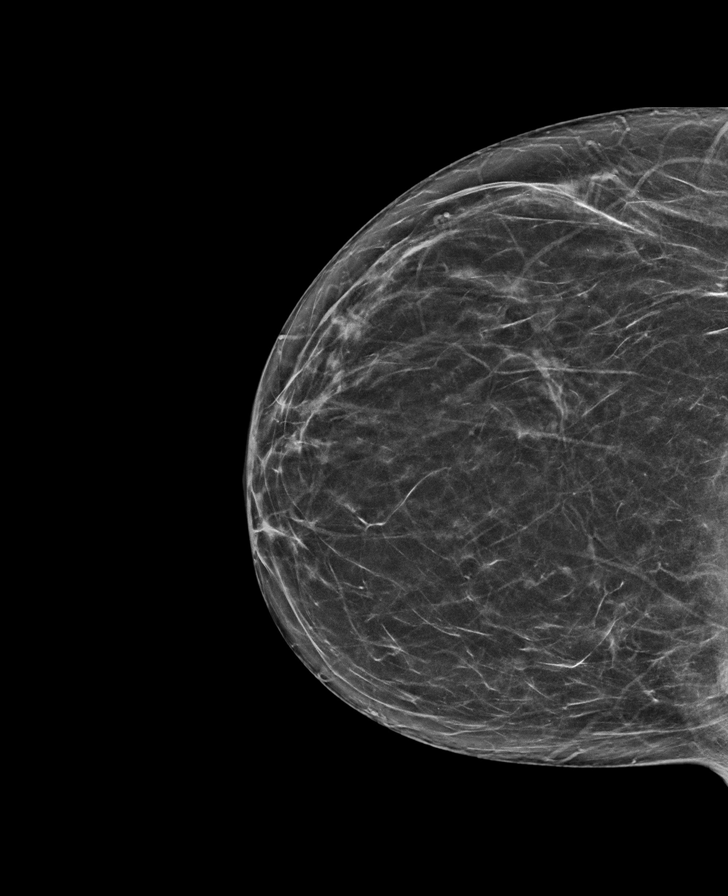

[L CC tomo · tomo slice 37/72.0]
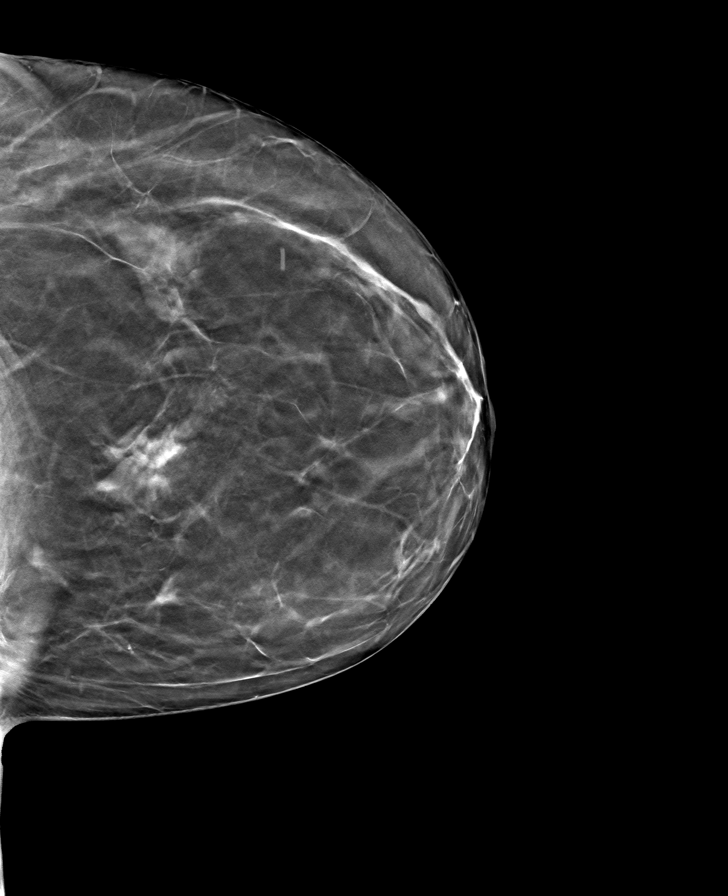

[R MLO tomo · tomo slice 34/67.0]
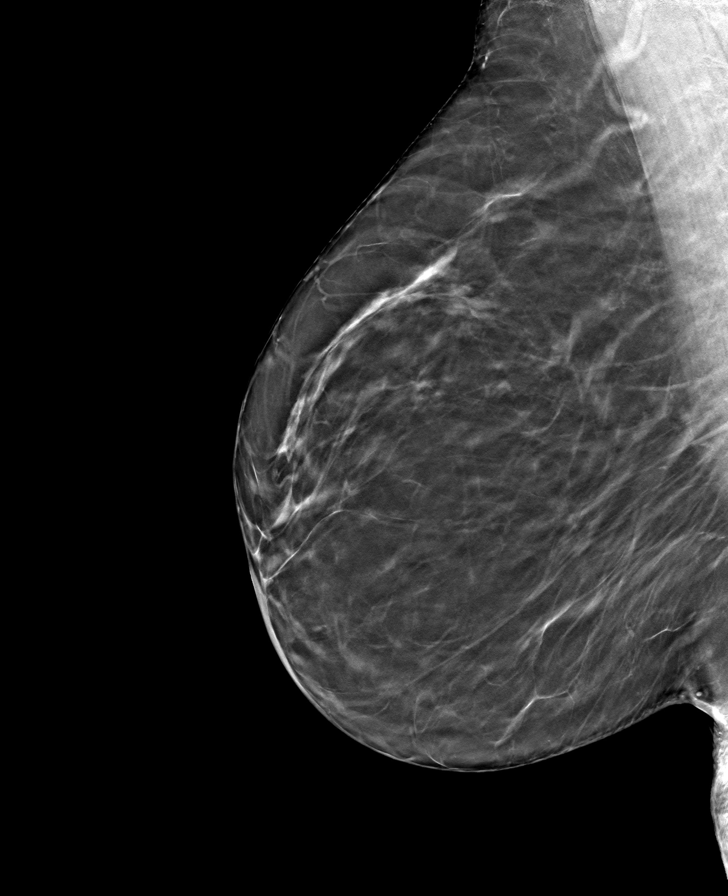

[R CC tomo · tomo slice 33/64.0]
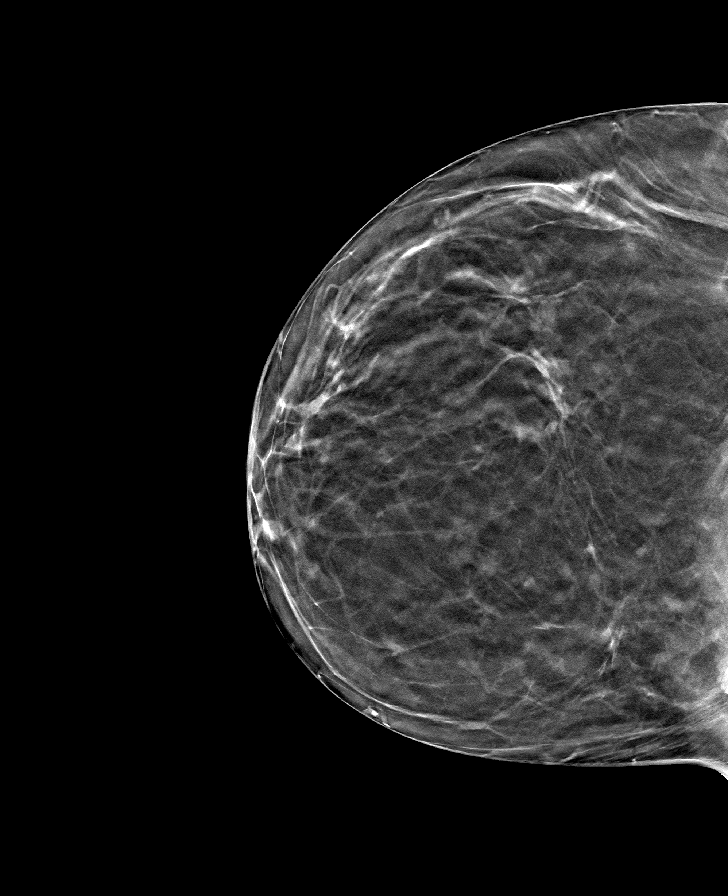

[L MLO tomo · tomo slice 37/73.0]
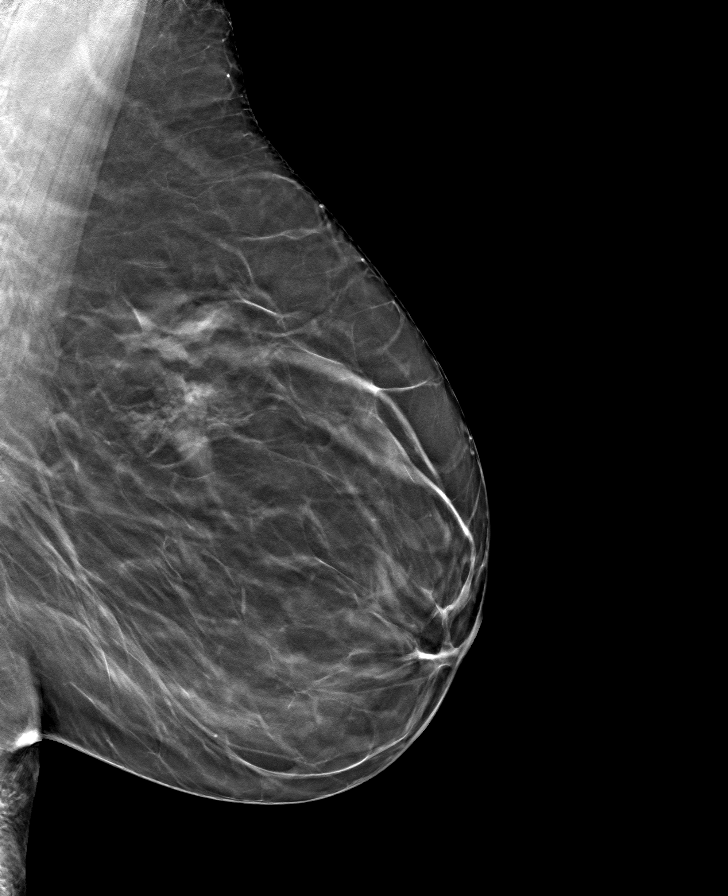

[8 of 24 positions shown; findings below may reference images not displayed]

ACR Breast Density Category b: There are scattered areas of
fibroglandular density.
FINDINGS: There are no findings suspicious for malignancy. Images were
processed with CAD.
IMPRESSION: No mammographic evidence of malignancy. A result letter of this
screening mammogram will be mailed directly to the patient.

RECOMMENDATION:
Screening mammogram in one year. (Code:[id number])

BI-RADS CATEGORY  1: Negative.

## 2022-02-21 DIAGNOSIS — F32A Depression, unspecified: Secondary | ICD-10-CM | POA: Diagnosis not present

## 2022-02-21 DIAGNOSIS — I10 Essential (primary) hypertension: Secondary | ICD-10-CM | POA: Diagnosis not present

## 2022-08-22 DIAGNOSIS — F32A Depression, unspecified: Secondary | ICD-10-CM | POA: Diagnosis not present

## 2022-08-22 DIAGNOSIS — Z6829 Body mass index (BMI) 29.0-29.9, adult: Secondary | ICD-10-CM | POA: Diagnosis not present

## 2022-08-22 DIAGNOSIS — I10 Essential (primary) hypertension: Secondary | ICD-10-CM | POA: Diagnosis not present

## 2022-08-22 DIAGNOSIS — E663 Overweight: Secondary | ICD-10-CM | POA: Diagnosis not present

## 2023-01-09 DIAGNOSIS — M8580 Other specified disorders of bone density and structure, unspecified site: Secondary | ICD-10-CM | POA: Diagnosis not present

## 2023-01-09 DIAGNOSIS — Z1331 Encounter for screening for depression: Secondary | ICD-10-CM | POA: Diagnosis not present

## 2023-01-09 DIAGNOSIS — I10 Essential (primary) hypertension: Secondary | ICD-10-CM | POA: Diagnosis not present

## 2023-01-09 DIAGNOSIS — M5432 Sciatica, left side: Secondary | ICD-10-CM | POA: Diagnosis not present

## 2023-01-09 DIAGNOSIS — Z23 Encounter for immunization: Secondary | ICD-10-CM | POA: Diagnosis not present

## 2023-02-01 DIAGNOSIS — H5213 Myopia, bilateral: Secondary | ICD-10-CM | POA: Diagnosis not present

## 2023-02-21 DIAGNOSIS — H25811 Combined forms of age-related cataract, right eye: Secondary | ICD-10-CM | POA: Diagnosis not present

## 2023-04-03 DIAGNOSIS — H269 Unspecified cataract: Secondary | ICD-10-CM | POA: Diagnosis not present

## 2023-04-03 DIAGNOSIS — H25811 Combined forms of age-related cataract, right eye: Secondary | ICD-10-CM | POA: Diagnosis not present

## 2023-04-18 DIAGNOSIS — K080 Exfoliation of teeth due to systemic causes: Secondary | ICD-10-CM | POA: Diagnosis not present

## 2023-04-25 DIAGNOSIS — K080 Exfoliation of teeth due to systemic causes: Secondary | ICD-10-CM | POA: Diagnosis not present

## 2023-06-12 ENCOUNTER — Other Ambulatory Visit: Payer: Self-pay | Admitting: Nurse Practitioner

## 2023-06-12 DIAGNOSIS — Z1231 Encounter for screening mammogram for malignant neoplasm of breast: Secondary | ICD-10-CM

## 2023-06-13 ENCOUNTER — Other Ambulatory Visit: Payer: Self-pay | Admitting: Nurse Practitioner

## 2023-06-13 DIAGNOSIS — M8580 Other specified disorders of bone density and structure, unspecified site: Secondary | ICD-10-CM

## 2023-06-13 DIAGNOSIS — Z1231 Encounter for screening mammogram for malignant neoplasm of breast: Secondary | ICD-10-CM

## 2023-06-22 DIAGNOSIS — Z1231 Encounter for screening mammogram for malignant neoplasm of breast: Secondary | ICD-10-CM | POA: Diagnosis not present

## 2023-06-22 DIAGNOSIS — R92323 Mammographic fibroglandular density, bilateral breasts: Secondary | ICD-10-CM | POA: Diagnosis not present

## 2023-07-04 DIAGNOSIS — K080 Exfoliation of teeth due to systemic causes: Secondary | ICD-10-CM | POA: Diagnosis not present

## 2023-07-05 ENCOUNTER — Ambulatory Visit: Payer: BC Managed Care – PPO

## 2023-07-10 DIAGNOSIS — I10 Essential (primary) hypertension: Secondary | ICD-10-CM | POA: Diagnosis not present

## 2023-07-10 DIAGNOSIS — Z111 Encounter for screening for respiratory tuberculosis: Secondary | ICD-10-CM | POA: Diagnosis not present

## 2023-07-10 DIAGNOSIS — I358 Other nonrheumatic aortic valve disorders: Secondary | ICD-10-CM | POA: Diagnosis not present

## 2023-07-10 DIAGNOSIS — Z83438 Family history of other disorder of lipoprotein metabolism and other lipidemia: Secondary | ICD-10-CM | POA: Diagnosis not present

## 2023-07-12 DIAGNOSIS — Z83438 Family history of other disorder of lipoprotein metabolism and other lipidemia: Secondary | ICD-10-CM | POA: Diagnosis not present

## 2023-07-12 DIAGNOSIS — I10 Essential (primary) hypertension: Secondary | ICD-10-CM | POA: Diagnosis not present

## 2023-09-13 DIAGNOSIS — K080 Exfoliation of teeth due to systemic causes: Secondary | ICD-10-CM | POA: Diagnosis not present

## 2023-09-19 DIAGNOSIS — K080 Exfoliation of teeth due to systemic causes: Secondary | ICD-10-CM | POA: Diagnosis not present

## 2023-10-31 DIAGNOSIS — L814 Other melanin hyperpigmentation: Secondary | ICD-10-CM | POA: Diagnosis not present

## 2023-11-13 DIAGNOSIS — H25812 Combined forms of age-related cataract, left eye: Secondary | ICD-10-CM | POA: Diagnosis not present

## 2023-12-18 DIAGNOSIS — Z1331 Encounter for screening for depression: Secondary | ICD-10-CM | POA: Diagnosis not present

## 2023-12-18 DIAGNOSIS — Z9181 History of falling: Secondary | ICD-10-CM | POA: Diagnosis not present

## 2023-12-18 DIAGNOSIS — Z139 Encounter for screening, unspecified: Secondary | ICD-10-CM | POA: Diagnosis not present

## 2023-12-18 DIAGNOSIS — Z0000 Encounter for general adult medical examination without abnormal findings: Secondary | ICD-10-CM | POA: Diagnosis not present

## 2024-01-11 DIAGNOSIS — K080 Exfoliation of teeth due to systemic causes: Secondary | ICD-10-CM | POA: Diagnosis not present

## 2024-01-15 DIAGNOSIS — E7800 Pure hypercholesterolemia, unspecified: Secondary | ICD-10-CM | POA: Diagnosis not present

## 2024-01-15 DIAGNOSIS — E559 Vitamin D deficiency, unspecified: Secondary | ICD-10-CM | POA: Diagnosis not present

## 2024-01-15 DIAGNOSIS — I10 Essential (primary) hypertension: Secondary | ICD-10-CM | POA: Diagnosis not present

## 2024-01-15 DIAGNOSIS — M25552 Pain in left hip: Secondary | ICD-10-CM | POA: Diagnosis not present

## 2024-01-19 ENCOUNTER — Other Ambulatory Visit: Payer: Self-pay | Admitting: Nurse Practitioner

## 2024-01-19 DIAGNOSIS — M8580 Other specified disorders of bone density and structure, unspecified site: Secondary | ICD-10-CM

## 2024-01-22 ENCOUNTER — Other Ambulatory Visit: Payer: BC Managed Care – PPO

## 2024-02-01 DIAGNOSIS — R6889 Other general symptoms and signs: Secondary | ICD-10-CM | POA: Diagnosis not present

## 2024-02-01 DIAGNOSIS — J209 Acute bronchitis, unspecified: Secondary | ICD-10-CM | POA: Diagnosis not present

## 2024-02-01 DIAGNOSIS — G4720 Circadian rhythm sleep disorder, unspecified type: Secondary | ICD-10-CM | POA: Diagnosis not present

## 2024-04-16 DIAGNOSIS — K080 Exfoliation of teeth due to systemic causes: Secondary | ICD-10-CM | POA: Diagnosis not present

## 2024-06-10 DIAGNOSIS — K080 Exfoliation of teeth due to systemic causes: Secondary | ICD-10-CM | POA: Diagnosis not present

## 2024-07-22 DIAGNOSIS — K080 Exfoliation of teeth due to systemic causes: Secondary | ICD-10-CM | POA: Diagnosis not present

## 2024-08-20 DIAGNOSIS — Z1231 Encounter for screening mammogram for malignant neoplasm of breast: Secondary | ICD-10-CM | POA: Diagnosis not present

## 2024-08-20 DIAGNOSIS — R92323 Mammographic fibroglandular density, bilateral breasts: Secondary | ICD-10-CM | POA: Diagnosis not present

## 2024-08-22 ENCOUNTER — Encounter: Payer: Self-pay | Admitting: Nurse Practitioner

## 2024-08-27 ENCOUNTER — Encounter: Payer: Self-pay | Admitting: Nurse Practitioner

## 2024-08-28 ENCOUNTER — Other Ambulatory Visit: Payer: Self-pay | Admitting: Nurse Practitioner

## 2024-08-28 DIAGNOSIS — M8580 Other specified disorders of bone density and structure, unspecified site: Secondary | ICD-10-CM

## 2024-09-05 ENCOUNTER — Other Ambulatory Visit: Payer: Self-pay

## 2024-09-05 ENCOUNTER — Ambulatory Visit
Admission: RE | Admit: 2024-09-05 | Discharge: 2024-09-05 | Disposition: A | Discharge status: Home / Self Care | Payer: Self-pay | Source: Ambulatory Visit | Attending: Nurse Practitioner | Admitting: Nurse Practitioner

## 2024-09-05 DIAGNOSIS — M8580 Other specified disorders of bone density and structure, unspecified site: Secondary | ICD-10-CM

## 2024-09-05 DIAGNOSIS — Z13820 Encounter for screening for osteoporosis: Secondary | ICD-10-CM | POA: Diagnosis not present

## 2024-09-05 DIAGNOSIS — M8589 Other specified disorders of bone density and structure, multiple sites: Secondary | ICD-10-CM | POA: Insufficient documentation

## 2024-10-29 DIAGNOSIS — K080 Exfoliation of teeth due to systemic causes: Secondary | ICD-10-CM | POA: Diagnosis not present
